# Patient Record
Sex: Male | Born: 2000 | Race: Asian | Hispanic: No | Marital: Single
Health system: Southern US, Community
[De-identification: ages and names within clinical notes are randomized; demographics above are authoritative.]

---
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
## 2020-02-12 ENCOUNTER — Ambulatory Visit: Payer: Self-pay | Attending: Internal Medicine

## 2020-02-12 DIAGNOSIS — Z23 Encounter for immunization: Secondary | ICD-10-CM

## 2020-02-12 NOTE — Progress Notes (Signed)
   Covid-19 Vaccination Clinic  Name:  EARMON SHERROW    MRN: [id number] DOB: 2001-02-16  02/12/2020  Mr. Magos was observed post Covid-19 immunization for 15 minutes without incident. He was provided with Vaccine Information Sheet and instruction to access the V-Safe system.   Mr. Cullars was instructed to call 911 with any severe reactions post vaccine: Marland Kitchen Difficulty breathing  . Swelling of face and throat  . A fast heartbeat  . A bad rash all over body  . Dizziness and weakness   Immunizations Administered    Name Date Dose VIS Date Route   Pfizer COVID-19 Vaccine 02/12/2020  3:47 PM 0.3 mL 10/24/2019 Intramuscular   Manufacturer: [employer], [employer]   Lot: [id number]   NDC: [address]-4

## 2020-03-08 ENCOUNTER — Ambulatory Visit: Payer: Self-pay | Attending: Internal Medicine

## 2020-03-08 DIAGNOSIS — Z23 Encounter for immunization: Secondary | ICD-10-CM

## 2020-03-08 NOTE — Progress Notes (Signed)
   Covid-19 Vaccination Clinic  Name:  Blake Gould    MRN: [id number] DOB: [birth year]  03/08/2020  Mr. [name] was observed post Covid-19 immunization for 15 minutes without incident. He was provided with Vaccine Information Sheet and instruction to access the V-Safe system.   Mr. [name] was instructed to call 911 with any severe reactions post vaccine: Marland Kitchen Difficulty breathing  . Swelling of face and throat  . A fast heartbeat  . A bad rash all over body  . Dizziness and weakness   Immunizations Administered    Name Date Dose VIS Date Route   Pfizer COVID-19 Vaccine 03/08/2020  2:47 PM 0.3 mL 01/07/2019 Intramuscular   Manufacturer: [employer], [employer]   Lot: [id number]   NDC: [address]-3
